# Patient Record
Sex: Male | Born: 1985 | Race: Black or African American | Hispanic: No | Marital: Single | Smoking: Never smoker
Health system: Southern US, Community
[De-identification: ages and names within clinical notes are randomized; demographics above are authoritative.]

## PROBLEM LIST (undated history)

## (undated) ENCOUNTER — Emergency Department (HOSPITAL_BASED_OUTPATIENT_CLINIC_OR_DEPARTMENT_OTHER): Admission: EM | Payer: Self-pay

## (undated) DIAGNOSIS — I10 Essential (primary) hypertension: Secondary | ICD-10-CM

---

## 2021-04-01 ENCOUNTER — Other Ambulatory Visit: Payer: Self-pay

## 2021-04-01 ENCOUNTER — Emergency Department (HOSPITAL_BASED_OUTPATIENT_CLINIC_OR_DEPARTMENT_OTHER)
Admission: EM | Admit: 2021-04-01 | Discharge: 2021-04-01 | Disposition: A | Discharge status: Home / Self Care | Payer: Self-pay | Attending: Emergency Medicine | Admitting: Emergency Medicine

## 2021-04-01 ENCOUNTER — Encounter (HOSPITAL_BASED_OUTPATIENT_CLINIC_OR_DEPARTMENT_OTHER): Payer: Self-pay

## 2021-04-01 ENCOUNTER — Other Ambulatory Visit (HOSPITAL_BASED_OUTPATIENT_CLINIC_OR_DEPARTMENT_OTHER): Payer: Self-pay

## 2021-04-01 DIAGNOSIS — Z202 Contact with and (suspected) exposure to infections with a predominantly sexual mode of transmission: Secondary | ICD-10-CM | POA: Insufficient documentation

## 2021-04-01 DIAGNOSIS — I10 Essential (primary) hypertension: Secondary | ICD-10-CM | POA: Insufficient documentation

## 2021-04-01 DIAGNOSIS — R369 Urethral discharge, unspecified: Secondary | ICD-10-CM | POA: Insufficient documentation

## 2021-04-01 HISTORY — DX: Essential (primary) hypertension: I10

## 2021-04-01 LAB — URINALYSIS, ROUTINE W REFLEX MICROSCOPIC
Bilirubin Urine: NEGATIVE
Glucose, UA: NEGATIVE mg/dL
Hgb urine dipstick: NEGATIVE
Ketones, ur: NEGATIVE mg/dL
Leukocytes,Ua: NEGATIVE
Nitrite: NEGATIVE
Protein, ur: NEGATIVE mg/dL
Specific Gravity, Urine: 1.024 (ref 1.005–1.030)
pH: 6 (ref 5.0–8.0)

## 2021-04-01 MED ORDER — LIDOCAINE HCL (PF) 1 % IJ SOLN
1.0000 mL | Freq: Once | INTRAMUSCULAR | Status: AC
  Administered 2021-04-01: 1 mL
  Filled 2021-04-01: qty 5

## 2021-04-01 MED ORDER — DOXYCYCLINE HYCLATE 100 MG PO TABS
100.0000 mg | ORAL_TABLET | Freq: Two times a day (BID) | ORAL | 0 refills | Status: DC
  Filled 2021-04-01: qty 14, 7d supply, fill #0

## 2021-04-01 MED ORDER — CEFTRIAXONE SODIUM 500 MG IJ SOLR
500.0000 mg | Freq: Once | INTRAMUSCULAR | Status: AC
  Administered 2021-04-01: 500 mg via INTRAMUSCULAR
  Filled 2021-04-01: qty 500

## 2021-04-01 NOTE — ED Provider Notes (Signed)
[facility] EM[facility] DEPT Provider Note   CSN: [id number] Arrival date & time: 04/01/21  [id number]     History No chief complaint on file.   Justin Meyer is a 35 y.o. male.  This is a 35 y.o. male with significant medical history as below, including hypertension who presents to the ED with complaint of concern for STI exposure.  Location: Penis Duration: 2 days Onset: Gradual Timing: Constant Description: White discharge from urethra Severity: Mild Exacerbating/Alleviating Factors: Not identified Associated Symptoms: Mild inguinal discomfort bilateral Pertinent Negatives: No scrotal or penile swelling, no rashes, no lesions, no abdominal pain or nausea vomiting.  No pain to right upper quadrant.  Denies IV drug use.  Context: Patient with 2 days of penile discharge.  Similar prior STD in the past.  No history HIV, partner is male and he is in monogamous relationship    The history is provided by the patient. No language interpreter was used.      Past Medical History:  Diagnosis Date   Hypertension     There are no problems to display for this patient.   History reviewed. No pertinent surgical history.     History reviewed. No pertinent family history.  Social History   Tobacco Use   Smoking status: Never   Smokeless tobacco: Never  Substance Use Topics   Drug use: Not Currently    Home Medications Prior to Admission medications   Medication Sig Start Date End Date Taking? Authorizing Provider  doxycycline (VIBRA-TABS) 100 MG tablet Take 1 tablet (100 mg total) by mouth 2 (two) times daily. 04/01/21  Yes Jeanell Sparrow, DO    Allergies    Patient has no known allergies.  Review of Systems   Review of Systems  Constitutional:  Negative for chills and fever.  HENT:  Negative for facial swelling and trouble swallowing.   Eyes:  Negative for photophobia and visual disturbance.  Respiratory:  Negative for cough and shortness of breath.    Cardiovascular:  Negative for chest pain and palpitations.  Gastrointestinal:  Negative for abdominal pain, nausea and vomiting.  Endocrine: Negative for polydipsia and polyuria.  Genitourinary:  Positive for penile discharge. Negative for difficulty urinating and hematuria.  Musculoskeletal:  Negative for gait problem and joint swelling.  Skin:  Negative for pallor and rash.  Neurological:  Negative for syncope and headaches.  Psychiatric/Behavioral:  Negative for agitation and confusion.    Physical Exam Updated Vital Signs BP (!) 147/87 (BP Location: Right Arm)   Pulse 76   Temp 98.7 F (37.1 C) (Oral)   Resp 16   Ht 5\' 9"  (1.753 m)   Wt 120.2 kg   SpO2 100%   BMI 39.13 kg/m   Physical Exam Vitals and nursing note reviewed. Exam conducted with a chaperone present.  Constitutional:      General: He is not in acute distress.    Appearance: He is well-developed.  HENT:     Head: Normocephalic and atraumatic.     Right Ear: External ear normal.     Left Ear: External ear normal.     Mouth/Throat:     Mouth: Mucous membranes are moist.  Eyes:     General: No scleral icterus. Cardiovascular:     Rate and Rhythm: Normal rate and regular rhythm.     Pulses: Normal pulses.     Heart sounds: Normal heart sounds.  Pulmonary:     Effort: Pulmonary effort is normal. No respiratory distress.  Breath sounds: Normal breath sounds.  Abdominal:     General: Abdomen is flat.     Palpations: Abdomen is soft.     Tenderness: There is no abdominal tenderness. There is no right CVA tenderness, left CVA tenderness or guarding.  Genitourinary:    Comments: No rashes or lesions present, no scrotal swelling.  Positive cremasteric reflex noted bilateral.  No perceptible urethral discharge on exam.  No tenderness to shaft of penis or glans.  There is minimal inguinal lymphadenopathy  b/l Musculoskeletal:        General: Normal range of motion.     Cervical back: Normal range of motion.      Right lower leg: No edema.     Left lower leg: No edema.  Skin:    General: Skin is warm and dry.     Capillary Refill: Capillary refill takes less than 2 seconds.  Neurological:     Mental Status: He is alert and oriented to person, place, and time.  Psychiatric:        Mood and Affect: Mood normal.        Behavior: Behavior normal.    ED Results / Procedures / Treatments   Labs (all labs ordered are listed, but only abnormal results are displayed) Labs Reviewed  URINALYSIS, ROUTINE W REFLEX MICROSCOPIC  GC/CHLAMYDIA PROBE AMP ([place]) NOT AT [facility]    EKG None  Radiology No results found.  Procedures Procedures   Medications Ordered in ED Medications  cefTRIAXone (ROCEPHIN) injection 500 mg (has no administration in time range)  lidocaine (PF) (XYLOCAINE) 1 % injection 1 mL (has no administration in time range)    ED Course  I have reviewed the triage vital signs and the nursing notes.  Pertinent labs & imaging results that were available during my care of the patient were reviewed by me and considered in my medical decision making (see chart for details).    MDM Rules/Calculators/A&P                           CC: pos STD  This patient complains of above; this involves an extensive number of treatment options and is a complaint that carries with it a high risk of complications and morbidity. Vital signs were reviewed. Serious etiologies considered.  Very low suspicion for acute intra-abdominal pathology, no evidence of torsion of testicle, incarcerated hernia, epididymitis, pyelonephritis, nephrolithiasis although these were considered on ddx.   Record review:  Previous records obtained and reviewed    Work up as above, notable for:  STD urine test sent, also UA  Management: I discussed with the patient the rationale for empiric antibiotic treatment in view of  their genitourinary symptoms suggestive of possible sexually transmitted infection  pending culture results.  Pt agreeable to empiric abx for presumed STD  Advised pt to refrain from sexual activity until completion of antibiotics. Advised him to request partner to be evaluated for STD.     The patient improved significantly and was discharged in stable condition. Detailed discussions were had with the patient regarding current findings, and need for close f/u with PCP or on call doctor. The patient has been instructed to return immediately if the symptoms worsen in any way for re-evaluation. Patient verbalized understanding and is in agreement with current care plan. All questions answered prior to discharge.        This chart was dictated using voice recognition software.  Despite best efforts  to proofread,  errors can occur which can change the documentation meaning.  Final Clinical Impression(s) / ED Diagnoses Final diagnoses:  Possible exposure to STD    Rx / DC Orders ED Discharge Orders          Ordered    doxycycline (VIBRA-TABS) 100 MG tablet  2 times daily        04/01/21 [place], Nolan, DO 04/01/21 [phone]

## 2021-04-01 NOTE — ED Notes (Signed)
Pt verbalized understanding of discharge instructions; opportunity for questions provided °

## 2021-04-01 NOTE — ED Triage Notes (Signed)
Pt c/o white penile discharge and bilateral groin pain x 3-4 days; endorses some dysuria; pt is sexually active, denies known sick contacts

## 2021-04-04 LAB — GC/CHLAMYDIA PROBE AMP (~~LOC~~) NOT AT ARMC
Chlamydia: NEGATIVE
Comment: NEGATIVE
Comment: NORMAL
Neisseria Gonorrhea: NEGATIVE

## 2021-09-08 ENCOUNTER — Emergency Department (HOSPITAL_BASED_OUTPATIENT_CLINIC_OR_DEPARTMENT_OTHER)
Admission: EM | Admit: 2021-09-08 | Discharge: 2021-09-08 | Disposition: A | Discharge status: Home / Self Care | Payer: Self-pay | Attending: Emergency Medicine | Admitting: Emergency Medicine

## 2021-09-08 ENCOUNTER — Other Ambulatory Visit: Payer: Self-pay

## 2021-09-08 ENCOUNTER — Emergency Department (HOSPITAL_BASED_OUTPATIENT_CLINIC_OR_DEPARTMENT_OTHER): Payer: Self-pay

## 2021-09-08 ENCOUNTER — Encounter (HOSPITAL_BASED_OUTPATIENT_CLINIC_OR_DEPARTMENT_OTHER): Payer: Self-pay

## 2021-09-08 DIAGNOSIS — W268XXA Contact with other sharp object(s), not elsewhere classified, initial encounter: Secondary | ICD-10-CM | POA: Insufficient documentation

## 2021-09-08 DIAGNOSIS — R000 Tachycardia, unspecified: Secondary | ICD-10-CM | POA: Insufficient documentation

## 2021-09-08 DIAGNOSIS — S61212A Laceration without foreign body of right middle finger without damage to nail, initial encounter: Secondary | ICD-10-CM | POA: Insufficient documentation

## 2021-09-08 DIAGNOSIS — I10 Essential (primary) hypertension: Secondary | ICD-10-CM | POA: Insufficient documentation

## 2021-09-08 DIAGNOSIS — S61210A Laceration without foreign body of right index finger without damage to nail, initial encounter: Secondary | ICD-10-CM | POA: Insufficient documentation

## 2021-09-08 MED ORDER — NAPROXEN 500 MG PO TABS
500.0000 mg | ORAL_TABLET | Freq: Two times a day (BID) | ORAL | 0 refills | Status: AC
  Filled 2021-09-08: qty 30, 15d supply, fill #0

## 2021-09-08 MED ORDER — CEPHALEXIN 500 MG PO CAPS
500.0000 mg | ORAL_CAPSULE | Freq: Four times a day (QID) | ORAL | 0 refills | Status: AC
  Filled 2021-09-08: qty 20, 5d supply, fill #0

## 2021-09-08 MED ORDER — LIDOCAINE HCL (PF) 1 % IJ SOLN
15.0000 mL | Freq: Once | INTRAMUSCULAR | Status: AC
Start: 2021-09-08 — End: 2021-09-08
  Administered 2021-09-08: 15 mL
  Filled 2021-09-08: qty 15

## 2021-09-08 NOTE — ED Notes (Signed)
Pt requested to leave without repeat vitals ?

## 2021-09-08 NOTE — ED Notes (Signed)
EDP at bedside  

## 2021-09-08 NOTE — ED Triage Notes (Signed)
Pt states he was jumping a creek with a machete in his had and slipped, causing him to cut his right index and middle finger. States he was clearing a path to his fishing spot. reports Tetanus vaccine in last 10 years.  ?

## 2021-09-08 NOTE — ED Provider Notes (Signed)
?[facility]PT ?Provider Note ? ? ?CSN: [id number] ?Arrival date & time: 09/08/21  1518 ? ?  ? ?History ? ?Chief Complaint  ?Patient presents with  ? Finger Injury  ? Laceration  ? ? ?Justin Meyer is a 36 y.o. male with chief complaint of laceration to his right index and middle finger less than 2 hours ago.  Was cutting his way through some brush with a machete, walked with a machete, tripped and fell and cut himself with a machete.  Patient immediately began bleeding.  Notes the fingers were not amputated, but there was "a lot of bleeding".  Denies numbness or tingling of the fingers or hand.  Still able to move the fingers, but states they are tender.  History of prior boxer's fracture repair to the same hand.  Denies blanching or discoloration of the fingers.  Denies recent illness.  States Tdap is up-to-date. ? ?The history is provided by the patient and medical records.  ?Laceration ? ?  ? ?Home Medications ?Prior to Admission medications   ?Medication Sig Start Date End Date Taking? Authorizing Provider  ?doxycycline (VIBRA-TABS) 100 MG tablet Take 1 tablet (100 mg total) by mouth 2 (two) times daily. ?Patient not taking: Reported on 09/08/2021 04/01/21   Sloan LeiterGray, Samuel A, DO  ?   ? ?Allergies    ?Patient has no known allergies.   ? ?Review of Systems   ?Review of Systems  ?Skin:  Positive for wound.  ? ?Physical Exam ?Updated Vital Signs ?BP (!) 142/92 (BP Location: Left Arm)   Pulse (!) 116   Temp 98.5 ?F (36.9 ?C)   Resp 18   Ht 5\' 8"  (1.727 m)   Wt 136.1 kg   SpO2 97%   BMI 45.61 kg/m?  ?Physical Exam ?Vitals and nursing note reviewed.  ?Constitutional:   ?   General: He is not in acute distress. ?   Appearance: Normal appearance. He is well-developed. He is not ill-appearing or diaphoretic.  ?HENT:  ?   Head: Normocephalic and atraumatic.  ?   Mouth/Throat:  ?   Mouth: Mucous membranes are moist.  ?   Pharynx: Oropharynx is clear.  ?Eyes:  ?   Conjunctiva/sclera: Conjunctivae  normal.  ?Cardiovascular:  ?   Rate and Rhythm: Regular rhythm. Tachycardia present.  ?   Pulses: Normal pulses.  ?   Heart sounds: Normal heart sounds. No murmur heard. ?Pulmonary:  ?   Effort: Pulmonary effort is normal. No respiratory distress.  ?   Breath sounds: Normal breath sounds.  ?Abdominal:  ?   Palpations: Abdomen is soft.  ?   Tenderness: There is no abdominal tenderness.  ?Musculoskeletal:     ?   General: No swelling.  ?   Cervical back: Neck supple. No rigidity.  ?   Comments: Deep lacerations observed of the right index and middle finger.  Full passive range of motion of the MCP, PIP, and DIP of both affected fingers.  Mild to moderate active range of motion of the PIP and DIP, with good active range of motion of the MCP both affected fingers.  Fingers and hands appear neurovascularly intact bilaterally.  CRT less than 2 seconds for all digits of the right hand.  Radial pulses 2+ bilaterally.  No blanching or pale discoloration of the fingertips.  No obvious bony or soft tissue mass or deformity.  Structures appear grossly aligned on exam.   ?Skin: ?   General: Skin is warm and dry.  ?  Capillary Refill: Capillary refill takes less than 2 seconds.  ?Neurological:  ?   Mental Status: He is alert and oriented to person, place, and time.  ?Psychiatric:     ?   Mood and Affect: Mood normal.  ? ? ?ED Results / Procedures / Treatments   ?Labs ?(all labs ordered are listed, but only abnormal results are displayed) ?Labs Reviewed - No data to display ? ?EKG ?None ? ?Radiology ?DG Hand Complete Right ? ?Result Date: 09/08/2021 ?CLINICAL DATA:  Fall, right hand laceration EXAM: RIGHT HAND - COMPLETE 3+ VIEW COMPARISON:  None. FINDINGS: There is no evidence of fracture or dislocation. There is no evidence of arthropathy or other focal bone abnormality. There is mild soft tissue swelling involving the base of the second and third digits. Surgical bandaging material noted surrounding these digits. IMPRESSION:  Soft tissue swelling.  No acute fracture or dislocation. Electronically Signed   By: Helyn Numbers M.D.   On: 09/08/2021 17:08   ? ?Procedures ?Marland Kitchen.Laceration Repair ? ?Date/Time: 09/11/2021 12:10 AM ?Performed by: Cecil Cobbs, PA-C ?Authorized by: Cecil Cobbs, PA-C  ? ?Consent:  ?  Consent obtained:  Verbal ?  Consent given by:  Patient ?  Risks, benefits, and alternatives were discussed: yes   ?  Risks discussed:  Infection, pain, tendon damage, poor wound healing, nerve damage, poor cosmetic result and need for additional repair ?  Alternatives discussed:  Delayed treatment and referral ?Universal protocol:  ?  Procedure explained and questions answered to patient or proxy's satisfaction: yes   ?  Patient identity confirmed:  Arm band and verbally with patient ?Anesthesia:  ?  Anesthesia method:  Nerve block ?  Block location:  Digital Block ?  Block needle gauge:  25 G ?  Block anesthetic:  Lidocaine 1% w/o epi ?  Block technique:  [employer] ?  Block injection procedure:  Anatomic landmarks identified, introduced needle, incremental injection, negative aspiration for blood and anatomic landmarks palpated ?  Block outcome:  Anesthesia achieved ?Laceration details:  ?  Location:  Finger ?  Finger location:  R long finger ?  Length (cm):  2 ?  Depth (mm):  1 ?Pre-procedure details:  ?  Preparation:  Patient was prepped and draped in usual sterile fashion and imaging obtained to evaluate for foreign bodies ?Exploration:  ?  Hemostasis achieved with:  Direct pressure ?  Imaging obtained: x-ray   ?  Imaging outcome: foreign body not noted   ?  Wound exploration: wound explored through full range of motion and entire depth of wound visualized   ?  Wound extent: no foreign bodies/material noted, no tendon damage noted, no underlying fracture noted and no vascular damage noted   ?  Contaminated: yes   ?Treatment:  ?  Area cleansed with:  Saline and povidone-iodine ?  Amount of cleaning:  Standard ?   Irrigation solution:  Sterile saline ?  Irrigation method:  Syringe ?Skin repair:  ?  Repair method:  Sutures ?  Suture size:  4-0 ?  Suture material:  Prolene ?  Suture technique:  Simple interrupted ?  Number of sutures:  4 ?Approximation:  ?  Approximation:  Close ?Repair type:  ?  Repair type:  Simple ?Post-procedure details:  ?  Dressing:  Sterile dressing, bulky dressing, non-adherent dressing and antibiotic ointment ?  Procedure completion:  Tolerated well, no immediate complications ?Marland Kitchen.Laceration Repair ? ?Date/Time: 09/11/2021 12:18 AM ?Performed by: Cecil Cobbs, PA-C ?Authorized by:  Cecil Cobbs, PA-C  ? ?Consent:  ?  Consent obtained:  Verbal ?  Consent given by:  Patient ?  Risks, benefits, and alternatives were discussed: yes   ?  Risks discussed:  Infection, pain, tendon damage, poor wound healing, nerve damage, poor cosmetic result and need for additional repair ?  Alternatives discussed:  Delayed treatment and referral ?Universal protocol:  ?  Procedure explained and questions answered to patient or proxy's satisfaction: yes   ?  Patient identity confirmed:  Arm band and verbally with patient ?Anesthesia:  ?  Anesthesia method:  Nerve block ?  Block location:  Digital Block ?  Block needle gauge:  25 G ?  Block anesthetic:  Lidocaine 1% w/o epi ?  Block technique:  [employer] ?  Block injection procedure:  Anatomic landmarks identified, introduced needle, incremental injection, negative aspiration for blood and anatomic landmarks palpated ?  Block outcome:  Anesthesia achieved ?Laceration details:  ?  Location:  Finger ?  Finger location:  R index finger ?  Length (cm):  3 ?  Depth (mm):  2 ?Pre-procedure details:  ?  Preparation:  Patient was prepped and draped in usual sterile fashion and imaging obtained to evaluate for foreign bodies ?Exploration:  ?  Hemostasis achieved with:  Direct pressure ?  Imaging obtained: x-ray   ?  Imaging outcome: foreign body not noted   ?  Wound  exploration: wound explored through full range of motion and entire depth of wound visualized   ?  Wound extent: no foreign bodies/material noted, no tendon damage noted, no underlying fracture noted and no vascu

## 2021-09-08 NOTE — Discharge Instructions (Addendum)
You have been provided with an antibiotic by the name of Keflex.  Take this with once every 6 hours for the next 5 days.  Always take with food and plenty of water. ? ?You have also been provided an anti-inflammatory that may be taken once every 12 hours for pain management.  Do not take ibuprofen or Motrin with this, as they are in the same family medications.  You may take Tylenol on top of this for additional pain management if needed. ? ?You have also been provided with the contact information for a hand specialist.  You may call him first thing in the morning to follow-up within the next 3 to 5 days for reevaluation and continued medical management. ? ?Instructions have been provided for you to take care of your finger wounds.  The sutures will remain in place for 7 to 10 days.  They may be removed by the hand specialist, a primary care, urgent care, or at the emergency department.  You may change your bandage every 2-3 days as demonstrated.  Return to the ED for new or worsening symptoms as discussed. ?

## 2021-09-09 ENCOUNTER — Other Ambulatory Visit (HOSPITAL_BASED_OUTPATIENT_CLINIC_OR_DEPARTMENT_OTHER): Payer: Self-pay

## 2021-09-21 ENCOUNTER — Other Ambulatory Visit: Payer: Self-pay

## 2021-09-21 ENCOUNTER — Encounter (HOSPITAL_BASED_OUTPATIENT_CLINIC_OR_DEPARTMENT_OTHER): Payer: Self-pay

## 2021-09-21 ENCOUNTER — Emergency Department (HOSPITAL_BASED_OUTPATIENT_CLINIC_OR_DEPARTMENT_OTHER)
Admission: EM | Admit: 2021-09-21 | Discharge: 2021-09-21 | Disposition: A | Discharge status: Home / Self Care | Payer: Self-pay | Attending: Emergency Medicine | Admitting: Emergency Medicine

## 2021-09-21 DIAGNOSIS — S61212D Laceration without foreign body of right middle finger without damage to nail, subsequent encounter: Secondary | ICD-10-CM | POA: Insufficient documentation

## 2021-09-21 DIAGNOSIS — Z4802 Encounter for removal of sutures: Secondary | ICD-10-CM | POA: Insufficient documentation

## 2021-09-21 DIAGNOSIS — X58XXXD Exposure to other specified factors, subsequent encounter: Secondary | ICD-10-CM | POA: Insufficient documentation

## 2021-09-21 DIAGNOSIS — S61210D Laceration without foreign body of right index finger without damage to nail, subsequent encounter: Secondary | ICD-10-CM | POA: Insufficient documentation

## 2021-09-21 NOTE — ED Provider Notes (Signed)
?  [facility] ?Provider Note ? ? ?CSN: [id number] ?Arrival date & time: 09/21/21  4401 ? ?  ? ?History ? ?Chief Complaint  ?Patient presents with  ? Suture / Staple Removal  ? ? ?Justin Meyer is a 36 y.o. male. ? ?The history is provided by the patient.  ?Suture / Staple Removal ?He comes in requesting suture removal, sutures placed in right second and third fingers on 4/27. ? ?Home Medications ?Prior to Admission medications   ?Medication Sig Start Date End Date Taking? Authorizing Provider  ?naproxen (NAPROSYN) 500 MG tablet Take 1 tablet (500 mg total) by mouth 2 (two) times daily. 09/08/21   Cecil Cobbs, PA-C  ?   ? ?Allergies    ?Patient has no known allergies.   ? ?Review of Systems   ?Review of Systems  ?All other systems reviewed and are negative. ? ?Physical Exam ?Updated Vital Signs ?BP (!) 143/86   Pulse 82   Temp 97.8 ?F (36.6 ?C) (Oral)   Resp 18   Ht 5\' 8"  (1.727 m)   Wt 136.1 kg   SpO2 95%   BMI 45.61 kg/m?  ?Physical Exam ?Vitals and nursing note reviewed.  ?Lacerations on right second and third fingers are healing well.  No evidence of erythema or swelling.  Sutures are in place. ? ?ED Results / Procedures / Treatments   ? ?Procedures ? Suture Removal ? ?Date/Time: 09/21/2021 6:52 AM ?Performed by: 11/21/2021, MD ?Authorized by: Dione Booze, MD  ? ?Consent:  ?  Consent obtained:  Verbal ?  Consent given by:  Patient ?  Risks, benefits, and alternatives were discussed: yes   ?  Risks discussed:  Bleeding, pain and wound separation ?  Alternatives discussed:  No treatment ?Universal protocol:  ?  Procedure explained and questions answered to patient or proxy's satisfaction: yes   ?  Relevant documents present and verified: yes   ?  Required blood products, implants, devices, and special equipment available: yes   ?  Site/side marked: yes   ?  Immediately prior to procedure, a time out was called: yes   ?  Patient identity confirmed:  Verbally with patient and  arm band ?Location:  ?  Location:  Upper extremity ?  Upper extremity location:  Hand ?  Hand location:  R index finger and R long finger ?Procedure details:  ?  Wound appearance:  No signs of infection, good wound healing and clean ?  Number of sutures removed:  9 ?Post-procedure details:  ?  Post-removal:  No dressing applied ?  Procedure completion:  Tolerated well, no immediate complications  ? ? ?Medications Ordered in ED ?Medications - No data to display ? ?ED Course/ Medical Decision Making/ A&P ?  ?                        ?Medical Decision Making ? ?Sutured lacerations of right second and third finger with appropriate healing.  Sutures are removed and patient is discharged.  Old records reviewed confirming ED visit for sutures on 4/27. ? ?Final Clinical Impression(s) / ED Diagnoses ?Final diagnoses:  ?Visit for suture removal  ? ? ?Rx / DC Orders ?ED Discharge Orders   ? ? None  ? ?  ? ? ?  ?5/27, MD ?09/21/21 [phone] ? ?

## 2021-09-21 NOTE — ED Triage Notes (Signed)
Request suture removal of right index, and middle finger. Sutures placed on 4/27.  ?

## 2021-10-23 ENCOUNTER — Emergency Department (HOSPITAL_COMMUNITY)
Admission: EM | Admit: 2021-10-23 | Discharge: 2021-10-23 | Disposition: A | Discharge status: Home / Self Care | Payer: Self-pay | Attending: Emergency Medicine | Admitting: Emergency Medicine

## 2021-10-23 ENCOUNTER — Emergency Department (HOSPITAL_COMMUNITY): Payer: Self-pay

## 2021-10-23 ENCOUNTER — Other Ambulatory Visit: Payer: Self-pay

## 2021-10-23 DIAGNOSIS — S21201A Unspecified open wound of right back wall of thorax without penetration into thoracic cavity, initial encounter: Secondary | ICD-10-CM | POA: Insufficient documentation

## 2021-10-23 DIAGNOSIS — Z23 Encounter for immunization: Secondary | ICD-10-CM | POA: Insufficient documentation

## 2021-10-23 DIAGNOSIS — S41001A Unspecified open wound of right shoulder, initial encounter: Secondary | ICD-10-CM | POA: Insufficient documentation

## 2021-10-23 DIAGNOSIS — W3400XA Accidental discharge from unspecified firearms or gun, initial encounter: Secondary | ICD-10-CM | POA: Insufficient documentation

## 2021-10-23 DIAGNOSIS — S81801A Unspecified open wound, right lower leg, initial encounter: Secondary | ICD-10-CM | POA: Insufficient documentation

## 2021-10-23 DIAGNOSIS — Z20822 Contact with and (suspected) exposure to covid-19: Secondary | ICD-10-CM | POA: Insufficient documentation

## 2021-10-23 DIAGNOSIS — S70212A Abrasion, left hip, initial encounter: Secondary | ICD-10-CM | POA: Insufficient documentation

## 2021-10-23 LAB — COMPREHENSIVE METABOLIC PANEL WITH GFR
ALT: 39 U/L (ref 0–44)
AST: 35 U/L (ref 15–41)
Albumin: 4.1 g/dL (ref 3.5–5.0)
Alkaline Phosphatase: 58 U/L (ref 38–126)
Anion gap: 18 — ABNORMAL HIGH (ref 5–15)
BUN: 14 mg/dL (ref 6–20)
CO2: 17 mmol/L — ABNORMAL LOW (ref 22–32)
Calcium: 9.1 mg/dL (ref 8.9–10.3)
Chloride: 102 mmol/L (ref 98–111)
Creatinine, Ser: 1.47 mg/dL — ABNORMAL HIGH (ref 0.61–1.24)
GFR, Estimated: >60 mL/min
Glucose, Bld: 140 mg/dL — ABNORMAL HIGH (ref 70–99)
Potassium: 3.3 mmol/L — ABNORMAL LOW (ref 3.5–5.1)
Sodium: 137 mmol/L (ref 135–145)
Total Bilirubin: 0.5 mg/dL (ref 0.3–1.2)
Total Protein: 7.3 g/dL (ref 6.5–8.1)

## 2021-10-23 LAB — ABO/RH: ABO/RH(D): AB POS

## 2021-10-23 LAB — CBC
HCT: 47 % (ref 39.0–52.0)
Hemoglobin: 15.8 g/dL (ref 13.0–17.0)
MCH: 31 pg (ref 26.0–34.0)
MCHC: 33.6 g/dL (ref 30.0–36.0)
MCV: 92.2 fL (ref 80.0–100.0)
Platelets: 269 10*3/uL (ref 150–400)
RBC: 5.1 MIL/uL (ref 4.22–5.81)
RDW: 13.2 % (ref 11.5–15.5)
WBC: 8.6 10*3/uL (ref 4.0–10.5)
nRBC: 0 % (ref 0.0–0.2)

## 2021-10-23 LAB — I-STAT CHEM 8, ED
BUN: 16 mg/dL (ref 6–20)
Calcium, Ion: 1.02 mmol/L — ABNORMAL LOW (ref 1.15–1.40)
Chloride: 103 mmol/L (ref 98–111)
Creatinine, Ser: 1.5 mg/dL — ABNORMAL HIGH (ref 0.61–1.24)
Glucose, Bld: 129 mg/dL — ABNORMAL HIGH (ref 70–99)
HCT: 47 % (ref 39.0–52.0)
Hemoglobin: 16 g/dL (ref 13.0–17.0)
Potassium: 3.5 mmol/L (ref 3.5–5.1)
Sodium: 139 mmol/L (ref 135–145)
TCO2: 20 mmol/L — ABNORMAL LOW (ref 22–32)

## 2021-10-23 LAB — LACTIC ACID, PLASMA
Lactic Acid, Venous: 6.4 mmol/L (ref 0.5–1.9)
Lactic Acid, Venous: 4.9 mmol/L (ref 0.5–1.9)

## 2021-10-23 LAB — RESP PANEL BY RT-PCR (FLU A&B, COVID) ARPGX2
Influenza A by PCR: NEGATIVE
Influenza B by PCR: NEGATIVE
SARS Coronavirus 2 by RT PCR: NEGATIVE

## 2021-10-23 LAB — PROTIME-INR
INR: 1.2 (ref 0.8–1.2)
Prothrombin Time: 15.5 s — ABNORMAL HIGH (ref 11.4–15.2)

## 2021-10-23 LAB — SAMPLE TO BLOOD BANK

## 2021-10-23 LAB — ETHANOL: Alcohol, Ethyl (B): 79 mg/dL — ABNORMAL HIGH (ref <10)

## 2021-10-23 LAB — BLOOD PRODUCT ORDER (VERBAL) VERIFICATION

## 2021-10-23 MED ORDER — CEPHALEXIN 500 MG PO CAPS: 500.0000 mg | ORAL_CAPSULE | Freq: Four times a day (QID) | ORAL | 0 refills | Status: AC

## 2021-10-23 MED ORDER — OXYCODONE-ACETAMINOPHEN 5-325 MG PO TABS: 1.0000 | ORAL_TABLET | Freq: Three times a day (TID) | ORAL | 0 refills | Status: AC | PRN

## 2021-10-23 MED ORDER — FENTANYL CITRATE PF 50 MCG/ML IJ SOSY
50.0000 ug | PREFILLED_SYRINGE | Freq: Once | INTRAMUSCULAR | Status: AC
  Filled 2021-10-23: qty 1

## 2021-10-23 MED ORDER — HYDROMORPHONE HCL 1 MG/ML IJ SOLN
1.0000 mg | Freq: Once | INTRAMUSCULAR | Status: AC
  Administered 2021-10-23: 1 mg via INTRAVENOUS
  Filled 2021-10-23: qty 1

## 2021-10-23 MED ORDER — SODIUM CHLORIDE 0.9 % IV BOLUS
1000.0000 mL | Freq: Once | INTRAVENOUS | Status: AC
  Administered 2021-10-23: 1000 mL via INTRAVENOUS

## 2021-10-23 MED ORDER — CEFAZOLIN SODIUM-DEXTROSE 2-4 GM/100ML-% IV SOLN
2.0000 g | Freq: Once | INTRAVENOUS | Status: AC
  Administered 2021-10-23: 2 g via INTRAVENOUS
  Filled 2021-10-23: qty 100

## 2021-10-23 MED ORDER — IOHEXOL 300 MG/ML  SOLN
100.0000 mL | Freq: Once | INTRAMUSCULAR | Status: AC | PRN
Start: 2021-10-23 — End: 2021-10-23
  Administered 2021-10-23: 100 mL via INTRAVENOUS

## 2021-10-23 MED ORDER — KETOROLAC TROMETHAMINE 15 MG/ML IJ SOLN
15.0000 mg | Freq: Once | INTRAMUSCULAR | Status: AC
  Administered 2021-10-23: 15 mg via INTRAVENOUS
  Filled 2021-10-23: qty 1

## 2021-10-23 MED ORDER — TETANUS-DIPHTH-ACELL PERTUSSIS 5-2.5-18.5 LF-MCG/0.5 IM SUSY
0.5000 mL | PREFILLED_SYRINGE | Freq: Once | INTRAMUSCULAR | Status: AC
  Administered 2021-10-23: 0.5 mL via INTRAMUSCULAR
  Filled 2021-10-23: qty 0.5

## 2021-10-23 MED ORDER — FENTANYL CITRATE PF 50 MCG/ML IJ SOSY
PREFILLED_SYRINGE | INTRAMUSCULAR | Status: AC
  Administered 2021-10-23: 50 ug via INTRAVENOUS
  Filled 2021-10-23: qty 1

## 2021-10-23 MED ORDER — FENTANYL CITRATE PF 50 MCG/ML IJ SOSY
50.0000 ug | PREFILLED_SYRINGE | Freq: Once | INTRAMUSCULAR | Status: AC
  Administered 2021-10-23: 50 ug via INTRAVENOUS

## 2021-10-24 ENCOUNTER — Ambulatory Visit (HOSPITAL_BASED_OUTPATIENT_CLINIC_OR_DEPARTMENT_OTHER): Payer: Self-pay | Admitting: Nurse Practitioner

## 2021-10-24 LAB — BPAM RBC
Blood Product Expiration Date: 202306242359
Blood Product Expiration Date: 202306242359
ISSUE DATE / TIME: 202306110420
ISSUE DATE / TIME: 202306110421
Unit Type and Rh: 5100
Unit Type and Rh: 5100

## 2021-10-24 LAB — TYPE AND SCREEN
ABO/RH(D): AB POS
Antibody Screen: NEGATIVE
Unit division: 0
Unit division: 0

## 2021-12-06 ENCOUNTER — Encounter (HOSPITAL_BASED_OUTPATIENT_CLINIC_OR_DEPARTMENT_OTHER): Payer: Self-pay | Admitting: Nurse Practitioner

## 2022-03-16 ENCOUNTER — Other Ambulatory Visit: Payer: Self-pay

## 2022-03-16 ENCOUNTER — Other Ambulatory Visit (HOSPITAL_BASED_OUTPATIENT_CLINIC_OR_DEPARTMENT_OTHER): Payer: Self-pay

## 2022-03-16 ENCOUNTER — Emergency Department (HOSPITAL_BASED_OUTPATIENT_CLINIC_OR_DEPARTMENT_OTHER)
Admission: EM | Admit: 2022-03-16 | Discharge: 2022-03-16 | Disposition: A | Discharge status: Home / Self Care | Payer: Self-pay | Attending: Emergency Medicine | Admitting: Emergency Medicine

## 2022-03-16 ENCOUNTER — Encounter (HOSPITAL_BASED_OUTPATIENT_CLINIC_OR_DEPARTMENT_OTHER): Payer: Self-pay | Admitting: Emergency Medicine

## 2022-03-16 DIAGNOSIS — R369 Urethral discharge, unspecified: Secondary | ICD-10-CM | POA: Insufficient documentation

## 2022-03-16 MED ORDER — DOXYCYCLINE HYCLATE 100 MG PO TABS
100.0000 mg | ORAL_TABLET | Freq: Two times a day (BID) | ORAL | 0 refills | Status: AC
  Filled 2022-03-16: qty 14, 7d supply, fill #0

## 2022-03-16 MED ORDER — CEFTRIAXONE SODIUM 500 MG IJ SOLR
500.0000 mg | Freq: Once | INTRAMUSCULAR | Status: AC
  Administered 2022-03-16: 500 mg via INTRAMUSCULAR
  Filled 2022-03-16: qty 500

## 2022-03-16 MED ORDER — LIDOCAINE HCL (PF) 1 % IJ SOLN
1.0000 mL | Freq: Once | INTRAMUSCULAR | Status: AC
  Administered 2022-03-16: 1 mL
  Filled 2022-03-16: qty 5

## 2022-03-16 NOTE — Discharge Instructions (Signed)
Due to the symptoms you are having, it is suspicious for sexually transmitted infections.  You are being treated empirically with medications.  You are still having lab test done.  Please make sure that you are partner is treated and you have safe sex until both of you are treated adequately.

## 2022-03-16 NOTE — ED Triage Notes (Signed)
Pt arrives to ED with c/o penile discharge x2 days.

## 2022-03-16 NOTE — ED Notes (Signed)
Patient verbalizes understanding of discharge instructions. Opportunity for questioning and answers were provided. Patient discharged from ED.  °

## 2022-03-16 NOTE — ED Provider Notes (Signed)
[facility] DEPT Provider Note   CSN: [id number] Arrival date & time: 03/16/22  0930     History  Chief Complaint  Patient presents with   Penile Discharge    Justin Meyer is a 36 y.o. male.  HPI 36 year old male no significant past medical history presents today complaining of urethral discharge.  He states that his partner was diagnosed with trichomonas several weeks ago and they are both treated.  He was not tested for any other STIs,.  Never was neither was his partner.  Partner is going to clinic this afternoon.  Reports a distant history of GC and chlamydia but nothing recently.  He denies any other symptoms including frequency of urination, fever, chills, nausea, vomiting.  Discharge is clear to white.     Home Medications Prior to Admission medications   Medication Sig Start Date End Date Taking? Authorizing Provider  doxycycline (VIBRA-TABS) 100 MG tablet Take 1 tablet (100 mg total) by mouth 2 (two) times daily. 03/16/22  Yes Pattricia Boss, MD  naproxen (NAPROSYN) 500 MG tablet Take 1 tablet (500 mg total) by mouth 2 (two) times daily. [id number]   Prince Rome, PA-C      Allergies    Patient has no known allergies.    Review of Systems   Review of Systems  Physical Exam Updated Vital Signs BP 125/80 (BP Location: Right Arm)   Pulse 80   Temp 98.1 F (36.7 C) (Oral)   Resp 17   Ht 1.753 m (5\' 9" )   Wt 129.3 kg   SpO2 98%   BMI 42.09 kg/m  Physical Exam Vitals and nursing note reviewed.  Constitutional:      Appearance: He is well-developed.  HENT:     Head: Normocephalic and atraumatic.     Right Ear: External ear normal.     Left Ear: External ear normal.     Nose: Nose normal.  Eyes:     Extraocular Movements: Extraocular movements intact.  Neck:     Trachea: No tracheal deviation.  Pulmonary:     Effort: Pulmonary effort is normal.  Genitourinary:    Penis: Normal.      Testes: Normal.     Comments:  Swab obtained Musculoskeletal:        General: Normal range of motion.  Skin:    General: Skin is warm and dry.  Neurological:     Mental Status: He is alert and oriented to person, place, and time.  Psychiatric:        Mood and Affect: Mood normal.        Behavior: Behavior normal.     ED Results / Procedures / Treatments   Labs (all labs ordered are listed, but only abnormal results are displayed) Labs Reviewed  GC/CHLAMYDIA PROBE AMP ([place]) NOT AT [facility]    EKG None  Radiology No results found.  Procedures Procedures    Medications Ordered in ED Medications  cefTRIAXone (ROCEPHIN) injection 500 mg (has no administration in time range)  lidocaine (PF) (XYLOCAINE) 1 % injection 1-2.1 mL (has no administration in time range)    ED Course/ Medical Decision Making/ A&P                           Medical Decision Making 36 year old male recent diagnosis of trichomonas, with one partner who also had trichomonas.  They were not tested for other STIs.  Given history of urethral discharge and  36 year old male will treat empirically for STIs with Rocephin and doxycycline.  Patient was advised regarding safe sex and need for both her nurse to be treated.  Voices understanding of medication and plan.  Risk Prescription drug management.           Final Clinical Impression(s) / ED Diagnoses Final diagnoses:  Urethral discharge    Rx / DC Orders ED Discharge Orders          Ordered    doxycycline (VIBRA-TABS) 100 MG tablet  2 times daily        03/16/22 0951              Pattricia Boss, MD 03/16/22 [phone]

## 2022-03-17 LAB — GC/CHLAMYDIA PROBE AMP (~~LOC~~) NOT AT ARMC
Chlamydia: NEGATIVE
Comment: NEGATIVE
Comment: NORMAL
Neisseria Gonorrhea: NEGATIVE

## 2022-03-29 ENCOUNTER — Ambulatory Visit: Payer: Self-pay | Admitting: Podiatry

## 2023-11-08 IMAGING — DX DG HAND COMPLETE 3+V*R*
3 series · 3 of 3 positions shown · non-contrast
Comparison: None.

CLINICAL DATA: Fall, right hand laceration

EXAM:
RIGHT HAND - COMPLETE 3+ VIEW

[hand ap]
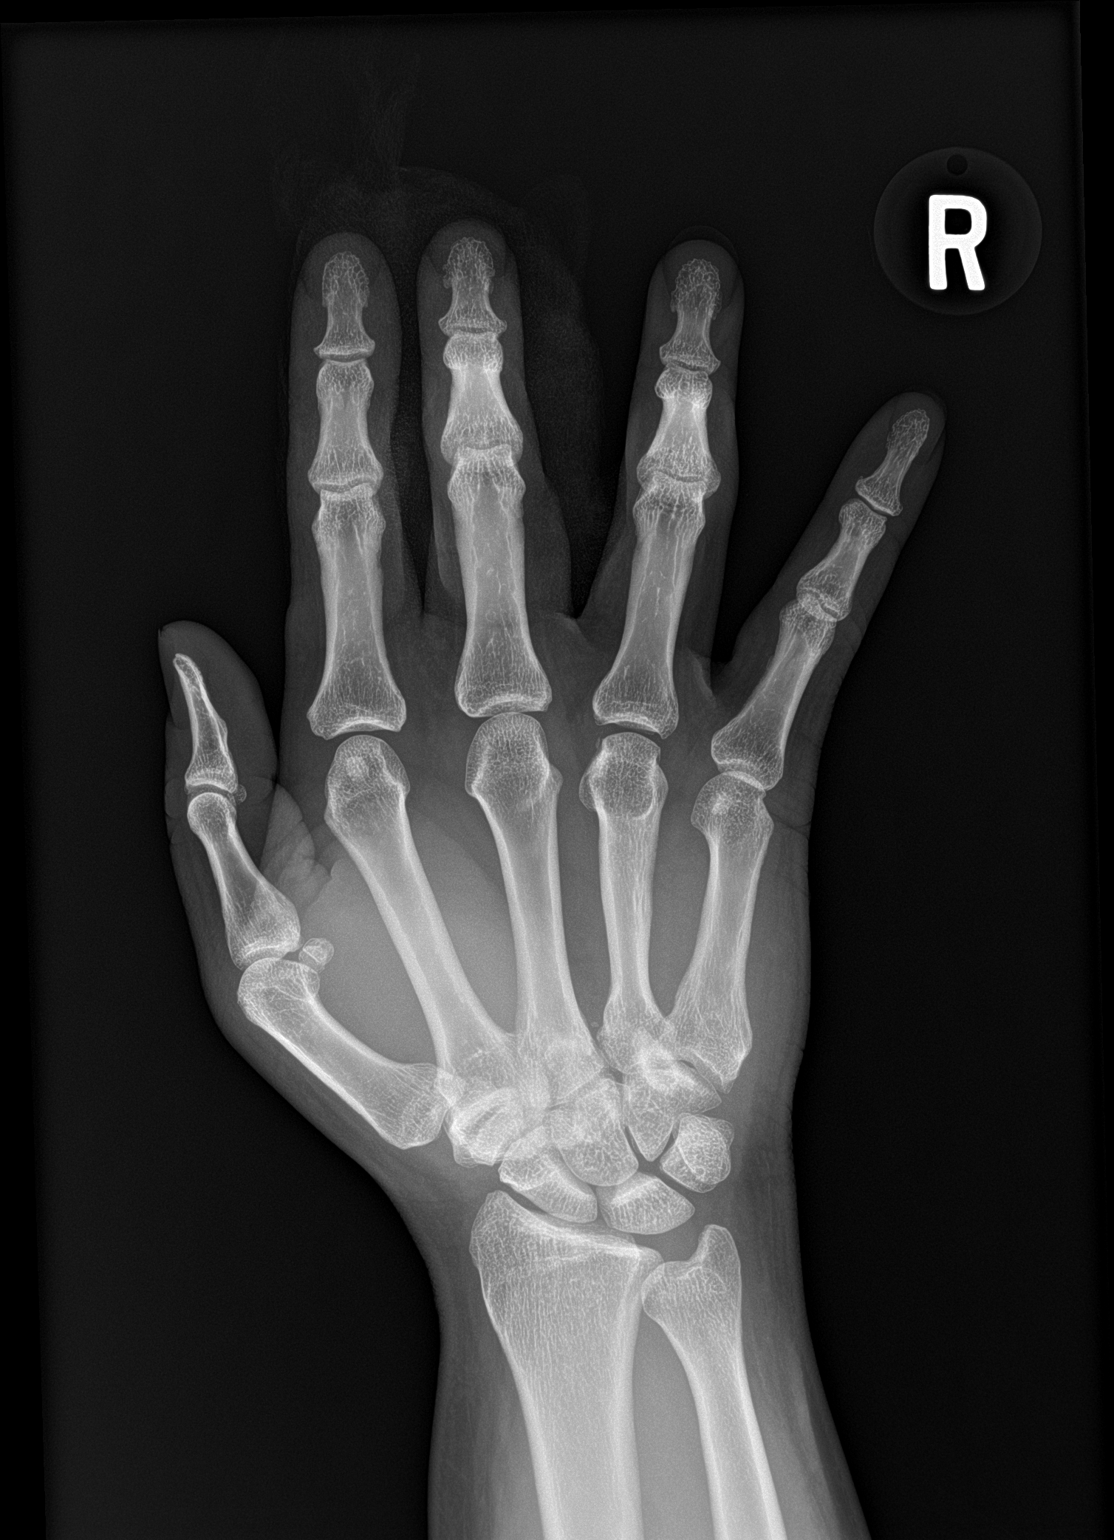

[hand obl]
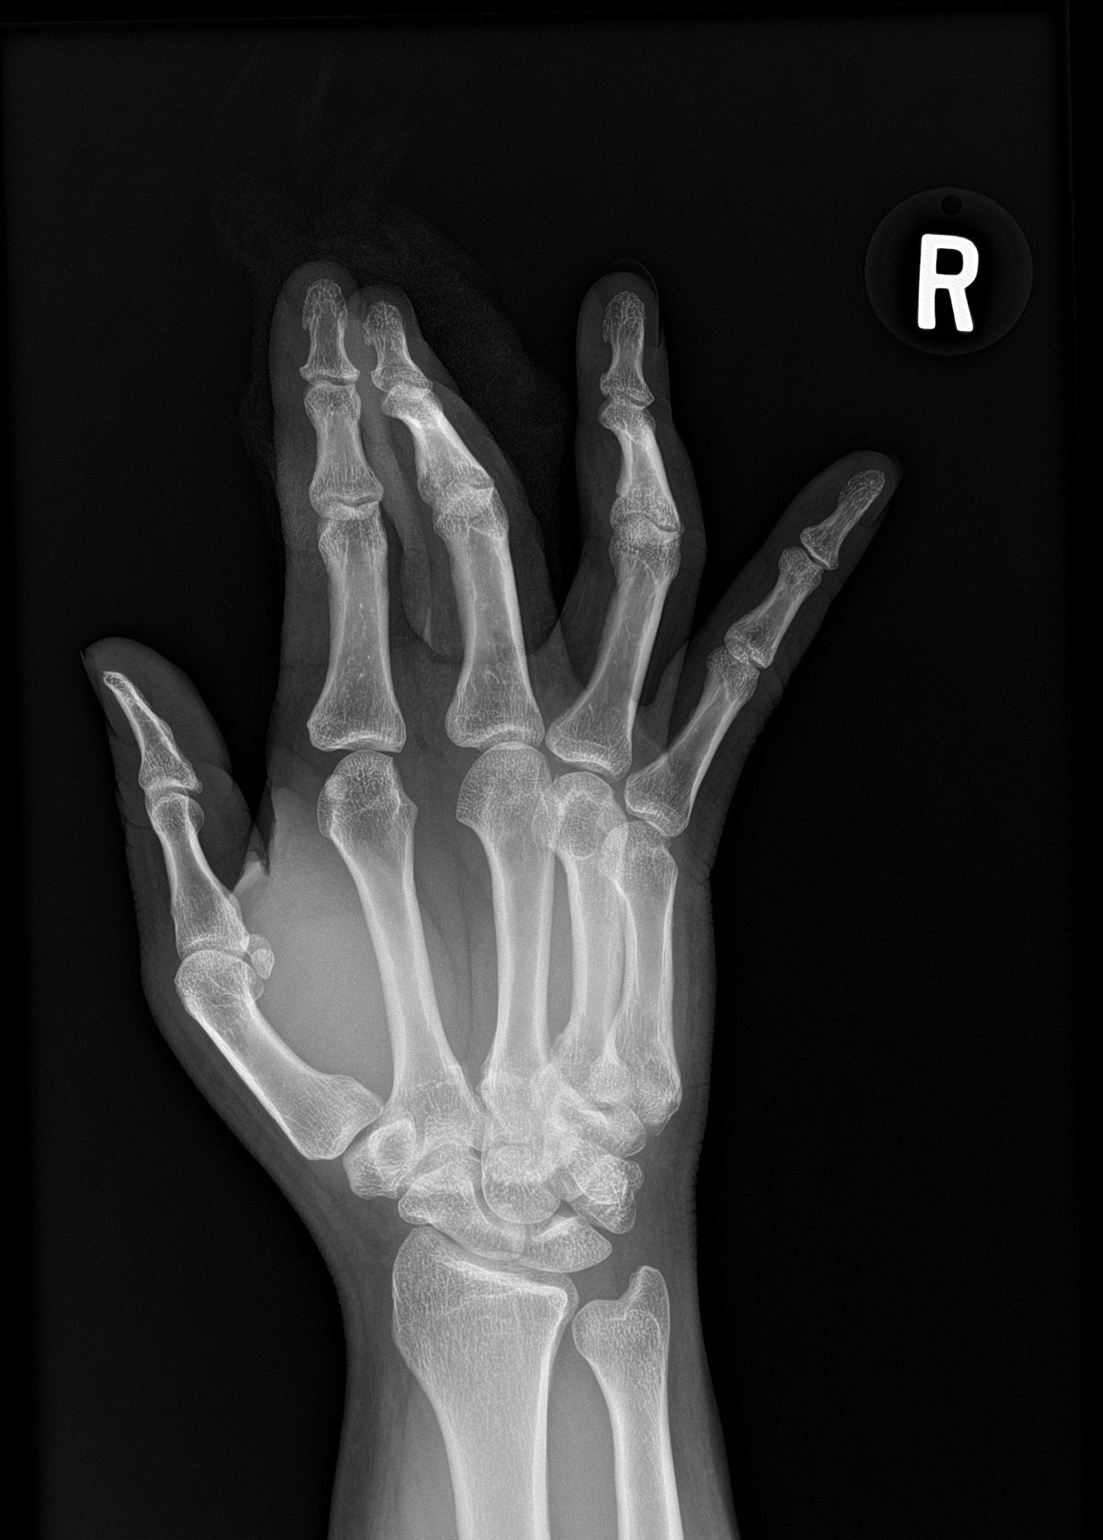

[hand lat]
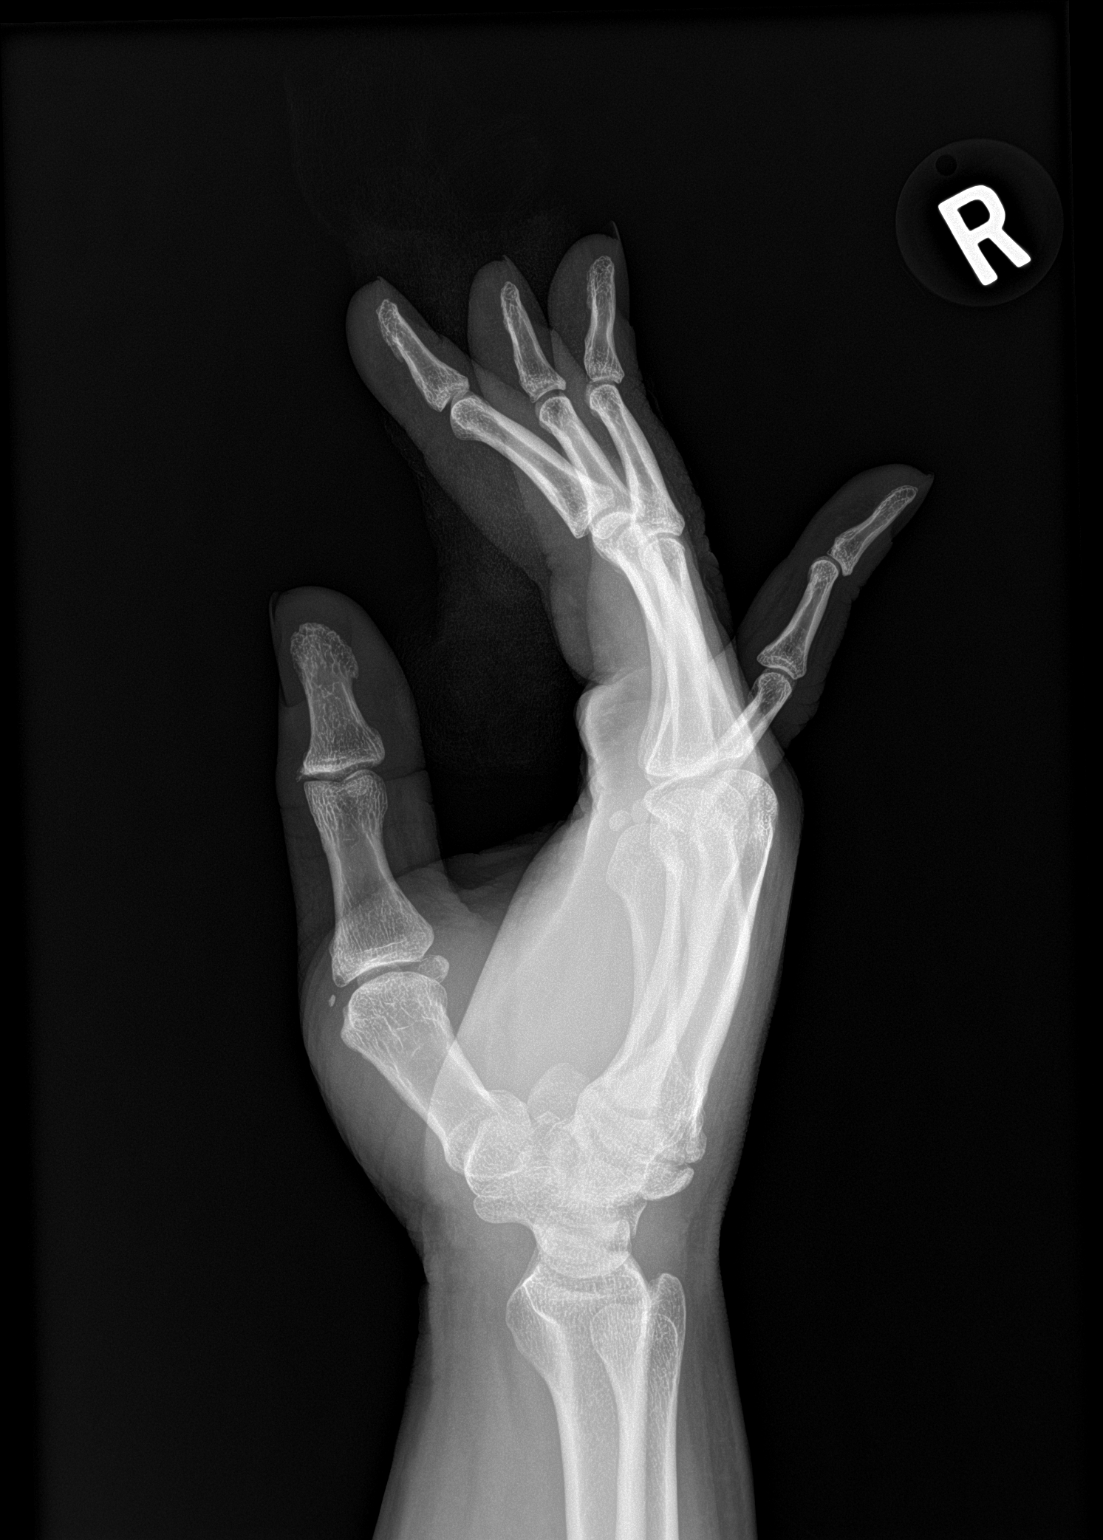

[3 of 3 positions shown; findings below may reference images not displayed]

FINDINGS: There is no evidence of fracture or dislocation. There is no
evidence of arthropathy or other focal bone abnormality. There is
mild soft tissue swelling involving the base of the second and third
digits. Surgical bandaging material noted surrounding these digits.
IMPRESSION: Soft tissue swelling.  No acute fracture or dislocation.
